# Patient Record
Sex: Male | Born: 1970 | Race: White | Hispanic: No | Marital: Single | Smoking: Former smoker
Health system: Southern US, Community
[De-identification: ages and names within clinical notes are randomized; demographics above are authoritative.]

## PROBLEM LIST (undated history)

## (undated) HISTORY — PX: ABOVE KNEE LEG AMPUTATION: SUR20

---

## 2020-10-09 ENCOUNTER — Other Ambulatory Visit: Payer: Self-pay

## 2020-10-09 ENCOUNTER — Emergency Department
Admission: EM | Admit: 2020-10-09 | Discharge: 2020-10-09 | Disposition: A | Discharge status: Home / Self Care | Payer: Self-pay | Attending: Student in an Organized Health Care Education/Training Program | Admitting: Student in an Organized Health Care Education/Training Program

## 2020-10-09 ENCOUNTER — Emergency Department: Payer: Self-pay

## 2020-10-09 DIAGNOSIS — W06XXXA Fall from bed, initial encounter: Secondary | ICD-10-CM | POA: Insufficient documentation

## 2020-10-09 DIAGNOSIS — S9031XA Contusion of right foot, initial encounter: Secondary | ICD-10-CM | POA: Insufficient documentation

## 2020-10-09 DIAGNOSIS — Z87891 Personal history of nicotine dependence: Secondary | ICD-10-CM | POA: Insufficient documentation

## 2020-10-09 DIAGNOSIS — M79605 Pain in left leg: Secondary | ICD-10-CM | POA: Insufficient documentation

## 2020-10-09 DIAGNOSIS — W19XXXA Unspecified fall, initial encounter: Secondary | ICD-10-CM

## 2020-10-09 MED ORDER — OXYCODONE HCL 5 MG PO TABS
10.0000 mg | ORAL_TABLET | ORAL | Status: DC | PRN
  Administered 2020-10-09: 10 mg via ORAL
  Filled 2020-10-09: qty 2

## 2020-10-09 MED ORDER — MORPHINE SULFATE (PF) 4 MG/ML IV SOLN
6.0000 mg | INTRAVENOUS | Status: DC | PRN
  Administered 2020-10-09: 6 mg via INTRAMUSCULAR
  Filled 2020-10-09: qty 2

## 2020-10-09 NOTE — ED Triage Notes (Signed)
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 Patient had AKA surgery 3 weeks ago due to blood clots. Patient is on blood thinners. Patient took 10mg  oxycodone at 1920.

## 2020-10-09 NOTE — ED Notes (Signed)
Patient states "I though my leg was there but it wasn't" patient describes "phantom pains" when speaking about fall.

## 2020-10-09 NOTE — Discharge Instructions (Signed)
  FINDINGS: Status post left above knee amputation. No acute fracture or dislocation is noted. Surgical staples remain over the stomach.   IMPRESSION: Status post left above knee amputation. No acute fracture or dislocation is noted.     Electronically Signed   By: Lupita Raider M.D.   On: 10/09/2020 21:49

## 2020-10-09 NOTE — ED Provider Notes (Addendum)
[facility] Emergency Department Provider Note    Event Date/Time   First MD Initiated Contact with Patient 10/09/20 2059     (approximate)  I have reviewed the triage vital signs and the nursing notes.   HISTORY  Chief Complaint Fall    HPI Preston Johnson is a 50 y.o. male complaint of left leg pain after he stood up and bed without assistance and fell onto his left leg.  Patient coming from [facility] after long complicated stay at [facility] for vascular surgery with right femoropopliteal surgery as well as left AKA.  Denies injuring anything else.  He does have extensive PAD with known eschar to the right foot no fevers no other complaints noted.  Was given oxycodone prior to arrival.    History reviewed. No pertinent past medical history. History reviewed. No pertinent family history.  There are no problems to display for this patient.     Prior to Admission medications   Not on File    Allergies Patient has no allergy information on record.    Social History Social History   Tobacco Use  . Smoking status: Former Smoker    Types: Cigarettes  . Smokeless tobacco: Former Neurosurgeon    Types: Snuff    Quit date: 09/19/2020  Vaping Use  . Vaping Use: Never used  Substance Use Topics  . Alcohol use: Not Currently  . Drug use: Never    Review of Systems Patient denies headaches, rhinorrhea, blurry vision, numbness, shortness of breath, chest pain, edema, cough, abdominal pain, nausea, vomiting, diarrhea, dysuria, fevers, rashes or hallucinations unless otherwise stated above in HPI. ____________________________________________   PHYSICAL EXAM:  VITAL SIGNS: Vitals:   10/09/20 2215 10/09/20 2230  BP:  (!) 153/96  Pulse: 99 99  Resp: (!) 26 (!) 22  Temp:    SpO2: 96% 98%    Constitutional: Alert and oriented.  Eyes: Conjunctivae are normal.  Head: Atraumatic. Nose: No congestion/rhinnorhea. Mouth/Throat: Mucous membranes are moist.    Neck: No stridor. Painless ROM.  Cardiovascular: Normal rate, regular rhythm. Grossly normal heart sounds.  Good peripheral circulation. Respiratory: Normal respiratory effort.  No retractions. Lungs CTAB. Gastrointestinal: Soft and nontender. No distention. No abdominal bruits. No CVA tenderness. Genitourinary:  Musculoskeletal: Left AKA with incision and staples intact no erythema no bleeding.  Does have small contusion but no obvious deformity.  2 cm eschar to the right ball of foot.  No overlying erythema crepitus noted.  Mild swelling.   Neurologic:  Normal speech and language. No gross focal neurologic deficits are appreciated. No facial droop Skin:  Skin is warm, dry and intact. No rash noted. Psychiatric: Mood and affect are normal. Speech and behavior are normal.  ____________________________________________   LABS (all labs ordered are listed, but only abnormal results are displayed)  No results found for this or any previous visit (from the past 24 hour(s)). ____________________________________________ ____________________________________________  RADIOLOGY  I personally reviewed all radiographic images ordered to evaluate for the above acute complaints and reviewed radiology reports and findings.  These findings were personally discussed with the patient.  Please see medical record for radiology report.  ____________________________________________   PROCEDURES  Procedure(s) performed:  Procedures    Critical Care performed: no ____________________________________________   INITIAL IMPRESSION / ASSESSMENT AND PLAN / ED COURSE  Pertinent labs & imaging results that were available during my care of the patient were reviewed by me and considered in my medical decision making (see chart for details).  DDX: fracture, contusion, dislocation  Preston Johnson is a 50 y.o. who presents to the ED with presentation as described above.  Left AKA wound and staples appear well  and intact.  Does have some contusion will order x-ray to make sure is no sign of fracture.  Does have some chronic ischemic changes and eschar to the right foot which appears consistent with documentation from yesterday at facility.  Patient states that this has been there for many days and is known about during his stay at [facility] with plan to follow-up as an outpatient.  Doppler signals present to RLE PT and DP.  No other injury noted.  X-ray reassuring.  Patient stable for continued outpatient follow-up.     The patient was evaluated in Emergency Department today for the symptoms described in the history of present illness. He/she was evaluated in the context of the global COVID-19 pandemic, which necessitated consideration that the patient might be at risk for infection with the SARS-CoV-2 virus that causes COVID-19. Institutional protocols and algorithms that pertain to the evaluation of patients at risk for COVID-19 are in a state of rapid change based on information released by regulatory bodies including the CDC and federal and state organizations. These policies and algorithms were followed during the patient's care in the ED.  As part of my medical decision making, I reviewed the following data within the electronic MEDICAL RECORD NUMBER Nursing notes reviewed and incorporated, Labs reviewed, notes from prior ED visits and [place] Controlled Substance Database   ____________________________________________   FINAL CLINICAL IMPRESSION(S) / ED DIAGNOSES  Final diagnoses:  Fall, initial encounter  Left leg pain      NEW MEDICATIONS STARTED DURING THIS VISIT:  New Prescriptions   No medications on file     Note:  This document was prepared using Dragon voice recognition software and may include unintentional dictation errors.    Willy Eddy, MD 10/09/20 2200    Willy Eddy, MD 10/09/20 2250

## 2020-10-09 NOTE — ED Notes (Signed)
ED Provider at bedside. 

## 2020-10-09 NOTE — ED Notes (Signed)
Attempt to call report to [facility] without answer from staff.

## 2022-04-21 IMAGING — CR DG FEMUR 2+V*L*
4 series · 4 of 4 positions shown · non-contrast
Comparison: None.

CLINICAL DATA: Fall.

EXAM:
LEFT FEMUR 2 VIEWS

[femur ap (1 of 2)]
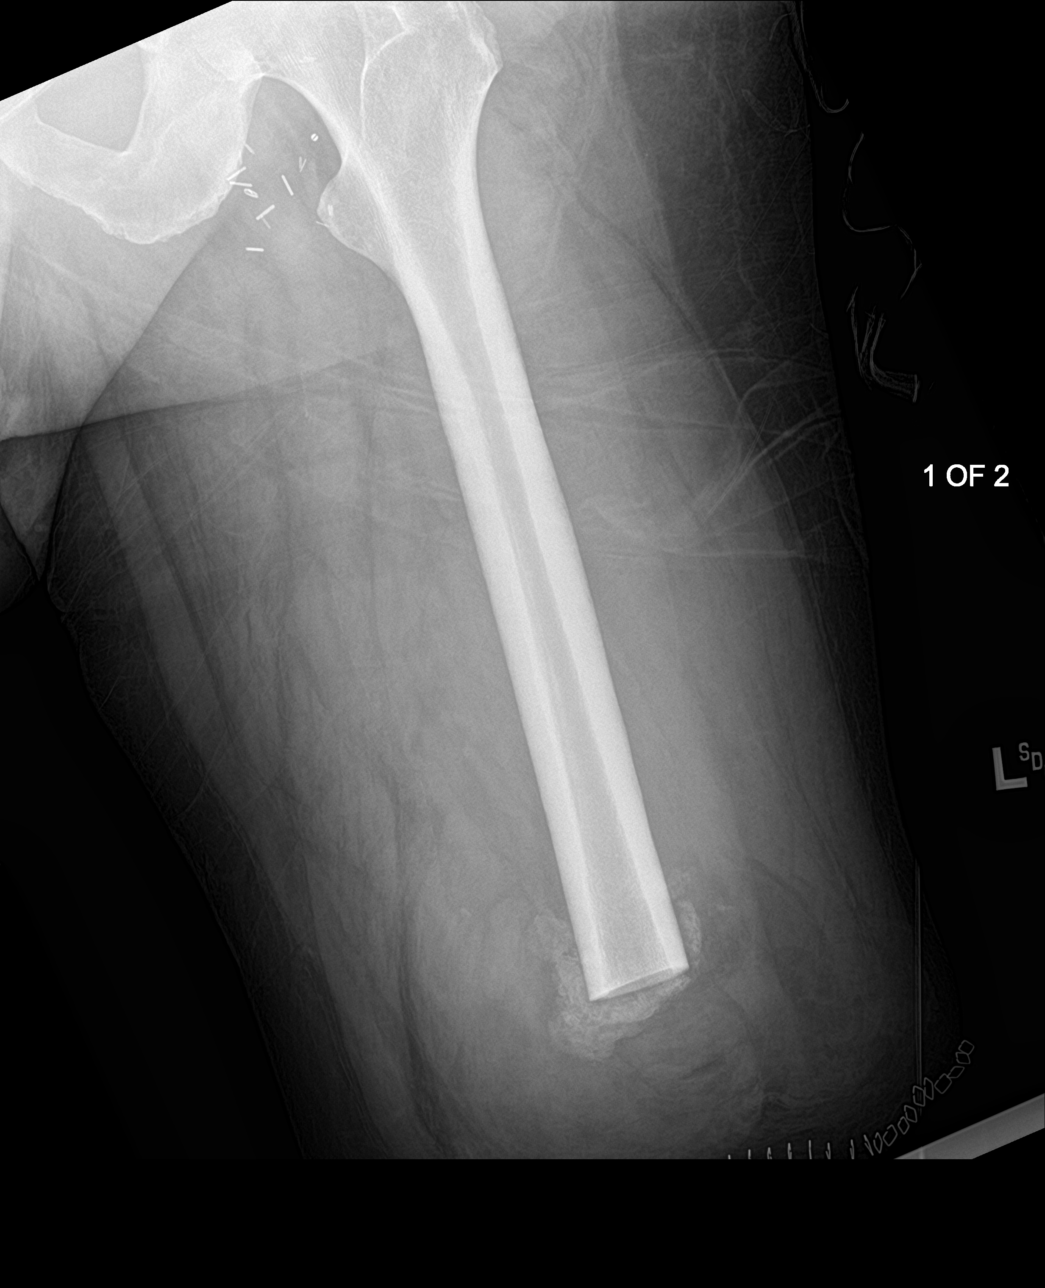

[femur ap (2 of 2)]
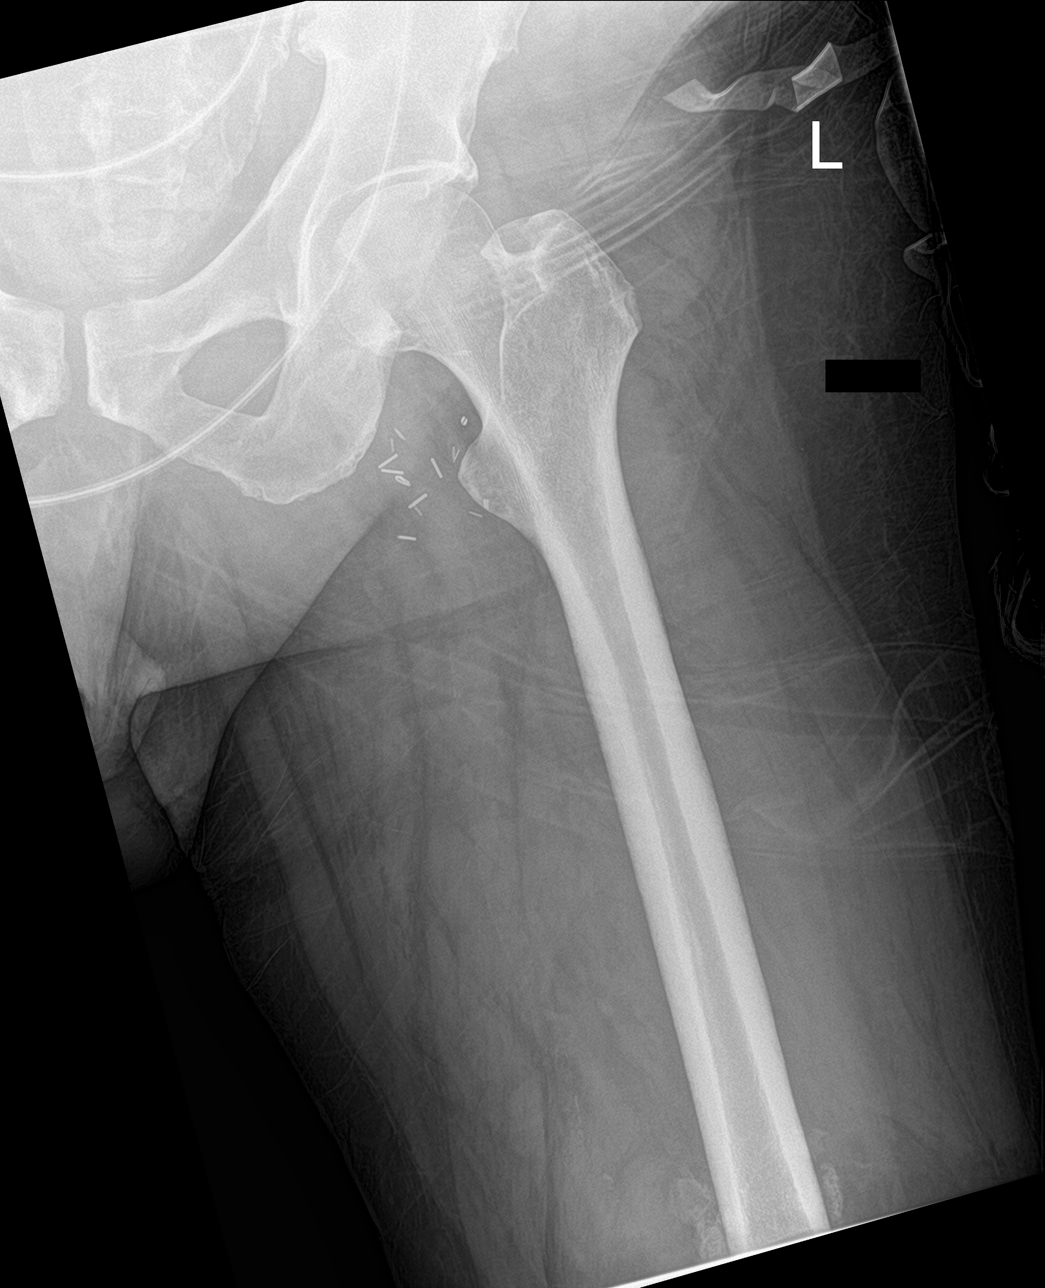

[femur lat (1 of 2)]
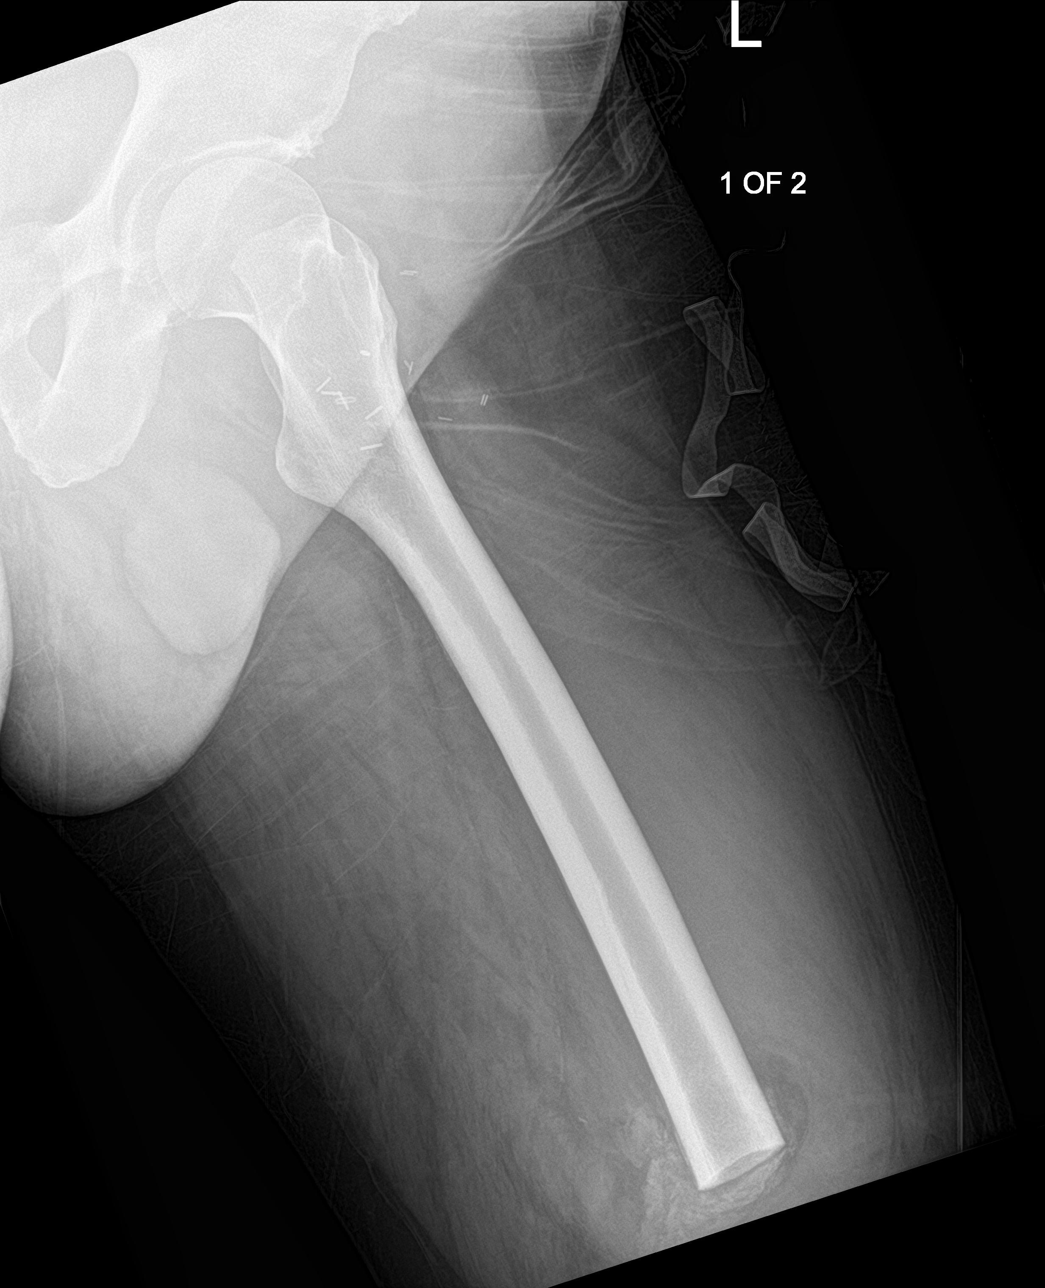

[femur lat (2 of 2)]
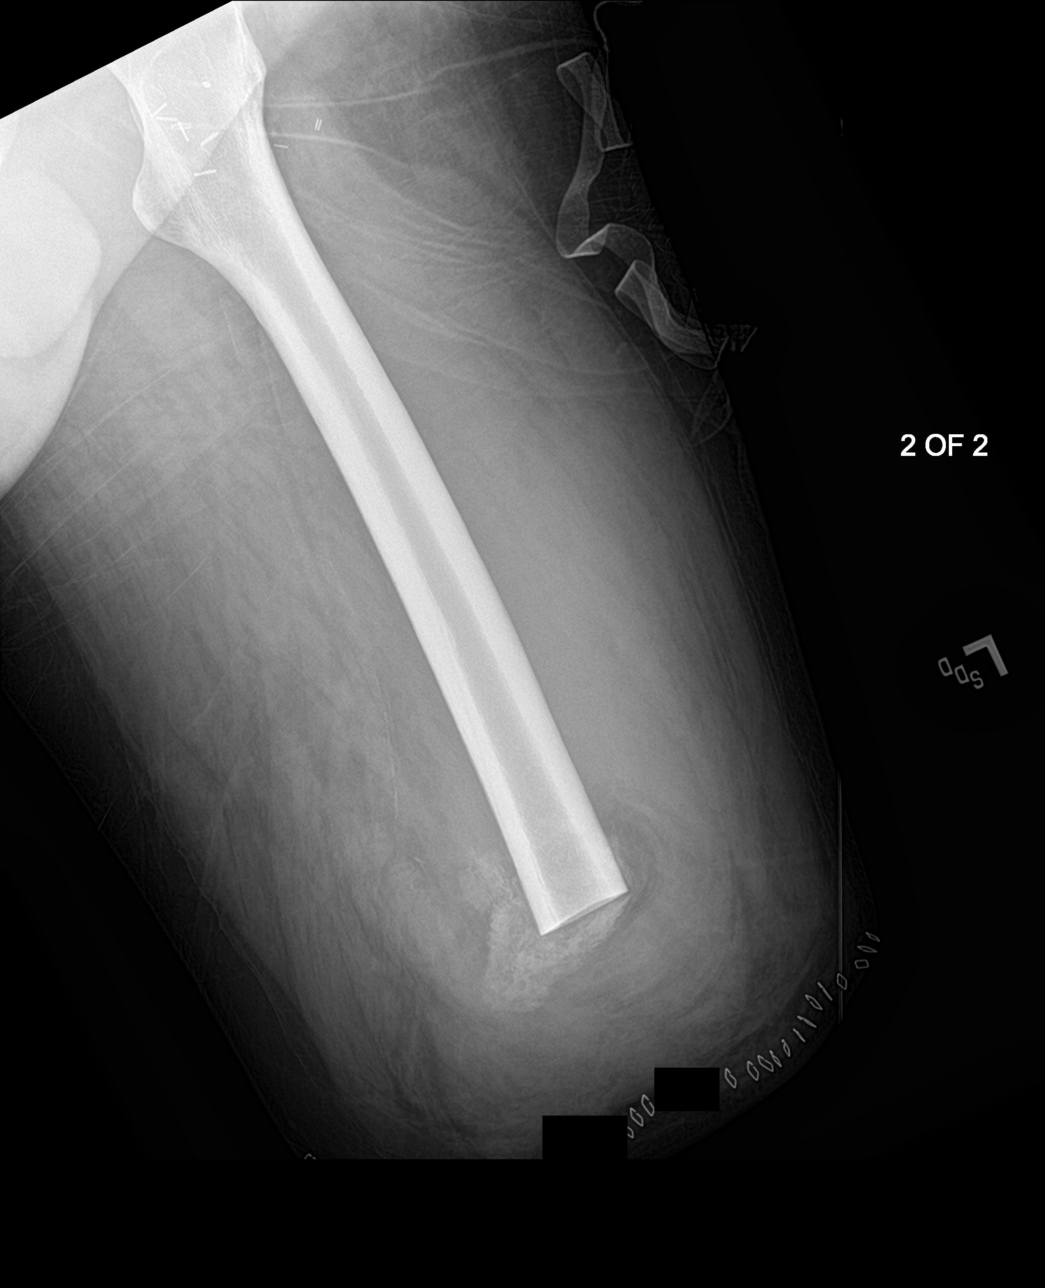

[4 of 4 positions shown; findings below may reference images not displayed]

FINDINGS: Status post left above knee amputation. No acute fracture or
dislocation is noted. Surgical staples remain over the stomach.
IMPRESSION: Status post left above knee amputation. No acute fracture or
dislocation is noted.
